# Patient Record
Sex: Male | Born: 1985 | Race: White | Hispanic: No | Marital: Single | Smoking: Current some day smoker
Health system: Southern US, Community
[De-identification: ages and names within clinical notes are randomized; demographics above are authoritative.]

---

## 2019-12-10 ENCOUNTER — Encounter (HOSPITAL_COMMUNITY): Payer: Self-pay

## 2019-12-10 ENCOUNTER — Emergency Department (HOSPITAL_COMMUNITY)
Admission: EM | Admit: 2019-12-10 | Discharge: 2019-12-11 | Disposition: A | Discharge status: Home / Self Care | Payer: 59 | Attending: Emergency Medicine | Admitting: Emergency Medicine

## 2019-12-10 ENCOUNTER — Emergency Department (HOSPITAL_COMMUNITY): Payer: 59

## 2019-12-10 ENCOUNTER — Other Ambulatory Visit: Payer: Self-pay

## 2019-12-10 DIAGNOSIS — Y9389 Activity, other specified: Secondary | ICD-10-CM | POA: Diagnosis not present

## 2019-12-10 DIAGNOSIS — S060X0A Concussion without loss of consciousness, initial encounter: Secondary | ICD-10-CM

## 2019-12-10 DIAGNOSIS — Y9269 Other specified industrial and construction area as the place of occurrence of the external cause: Secondary | ICD-10-CM | POA: Insufficient documentation

## 2019-12-10 DIAGNOSIS — W228XXA Striking against or struck by other objects, initial encounter: Secondary | ICD-10-CM | POA: Insufficient documentation

## 2019-12-10 DIAGNOSIS — Y990 Civilian activity done for income or pay: Secondary | ICD-10-CM | POA: Diagnosis not present

## 2019-12-10 DIAGNOSIS — S0990XA Unspecified injury of head, initial encounter: Secondary | ICD-10-CM

## 2019-12-10 NOTE — ED Triage Notes (Signed)
Patient arrived stating today he was hit with a concrete hose at work around 1-130pm. States he worked the rest of his shift and never lost consciousness but had occasional blurred vision. He went home and fell asleep around 6, when waking up had head and neck pain with nausea and vomiting. Denies any medication taken at home.

## 2019-12-10 NOTE — ED Provider Notes (Signed)
[facility] DEPT Provider Note   CSN: [id number] Arrival date & time: 12/10/19  2104     History Chief Complaint  Patient presents with  . Head Injury    Joseph Montes is a 34 y.o. male.  Patient presents to the emergency department with a chief complaint of head injury.  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  He states that he was manipulating the hose, and was struck in the head with the metal tip of the hose.  He states that it first knocked his hard hat off, and then the metal portion hit the back of his head quite hard.  He did not lose consciousness.  He continued on with his work day.  He states that after awakening from a nap this evening, he had a bad headache and some neck pain and had nausea and vomiting.  He denies having taken anything for symptoms.  Denies any numbness, weakness, or tingling.  The history is provided by the patient. No language interpreter was used.       History reviewed. No pertinent past medical history.  There are no problems to display for this patient.   History reviewed. No pertinent surgical history.     No family history on file.  Social History   Tobacco Use  . Smoking status: Not on file  Substance Use Topics  . Alcohol use: Not on file  . Drug use: Not on file    Home Medications Prior to Admission medications   Not on File    Allergies    Patient has no known allergies.  Review of Systems   Review of Systems  All other systems reviewed and are negative.   Physical Exam Updated Vital Signs BP 133/90 (BP Location: Left Arm)   Pulse 85   Temp 97.8 F (36.6 C) (Oral)   Resp 18   Ht 5\' 11"  (1.803 m)   Wt 70.3 kg   SpO2 100%   BMI 21.62 kg/m   Physical Exam Vitals and nursing note reviewed.  Constitutional:      Appearance: He is well-developed.  HENT:     Head: Normocephalic and atraumatic.  Eyes:   Conjunctiva/sclera: Conjunctivae normal.  Neck:     Comments: Cervical paraspinal muscles are TTP, but no bony abnormality or deformity Cardiovascular:     Rate and Rhythm: Normal rate and regular rhythm.     Heart sounds: No murmur.  Pulmonary:     Effort: Pulmonary effort is normal. No respiratory distress.     Breath sounds: Normal breath sounds.  Abdominal:     Palpations: Abdomen is soft.     Tenderness: There is no abdominal tenderness.  Musculoskeletal:     Cervical back: Neck supple.  Skin:    General: Skin is warm and dry.     Comments: No abrasions or contusions to the scalp  Neurological:     Mental Status: He is alert and oriented to person, place, and time.     Comments: Cranial nerves grossly intact, speech is clear, movements are goal oriented  Psychiatric:        Mood and Affect: Mood normal.        Behavior: Behavior normal.     ED Results / Procedures / Treatments   Labs (all labs ordered are listed, but only abnormal results are displayed) Labs Reviewed - No data to display  EKG None  Radiology CT Head Wo Contrast  Result Date: 12/10/2019 CLINICAL DATA:  Head trauma EXAM: CT HEAD WITHOUT CONTRAST CT CERVICAL SPINE WITHOUT CONTRAST TECHNIQUE: Multidetector CT imaging of the head and cervical spine was performed following the standard protocol without intravenous contrast. Multiplanar CT image reconstructions of the cervical spine were also generated. COMPARISON:  None. FINDINGS: CT HEAD FINDINGS Brain: No evidence of acute infarction, hemorrhage, hydrocephalus, extra-axial collection or mass lesion/mass effect. Vascular: No hyperdense vessel or unexpected calcification. Skull: Normal. Negative for fracture or focal lesion. Sinuses/Orbits: Mucosal thickening in the sphenoid sinuses Other: None CT CERVICAL SPINE FINDINGS Alignment: Mild reversal of cervical lordosis.  No subluxation. Skull base and vertebrae: No acute fracture. No primary bone lesion or focal  pathologic process. Soft tissues and spinal canal: No prevertebral fluid or swelling. No visible canal hematoma. Disc levels: Disc spaces are maintained. Mild asymmetric widening of the left facet joint at C5-C6. No subluxation. Negative for perched or locked facet. Patient slightly tilted to the right on coronal views. Upper chest: Negative. Other: None IMPRESSION: 1. Negative non contrasted CT appearance of the brain. 2. Mild reversal of cervical lordosis without acute fracture 3. Mild asymmetric widening of the left facet joint at C5-C6, favor positional artifact over facet joint capsule injury. Correlate with physical examination. MRI evaluation may be obtained if clinically warranted. Electronically Signed   By: Jasmine Pang M.D.   On: 12/10/2019 23:17   CT Cervical Spine Wo Contrast  Result Date: 12/10/2019 CLINICAL DATA:  Head trauma EXAM: CT HEAD WITHOUT CONTRAST CT CERVICAL SPINE WITHOUT CONTRAST TECHNIQUE: Multidetector CT imaging of the head and cervical spine was performed following the standard protocol without intravenous contrast. Multiplanar CT image reconstructions of the cervical spine were also generated. COMPARISON:  None. FINDINGS: CT HEAD FINDINGS Brain: No evidence of acute infarction, hemorrhage, hydrocephalus, extra-axial collection or mass lesion/mass effect. Vascular: No hyperdense vessel or unexpected calcification. Skull: Normal. Negative for fracture or focal lesion. Sinuses/Orbits: Mucosal thickening in the sphenoid sinuses Other: None CT CERVICAL SPINE FINDINGS Alignment: Mild reversal of cervical lordosis.  No subluxation. Skull base and vertebrae: No acute fracture. No primary bone lesion or focal pathologic process. Soft tissues and spinal canal: No prevertebral fluid or swelling. No visible canal hematoma. Disc levels: Disc spaces are maintained. Mild asymmetric widening of the left facet joint at C5-C6. No subluxation. Negative for perched or locked facet. Patient slightly  tilted to the right on coronal views. Upper chest: Negative. Other: None IMPRESSION: 1. Negative non contrasted CT appearance of the brain. 2. Mild reversal of cervical lordosis without acute fracture 3. Mild asymmetric widening of the left facet joint at C5-C6, favor positional artifact over facet joint capsule injury. Correlate with physical examination. MRI evaluation may be obtained if clinically warranted. Electronically Signed   By: Jasmine Pang M.D.   On: 12/10/2019 23:17    Procedures Procedures (including critical care time)  Medications Ordered in ED Medications - No data to display  ED Course  I have reviewed the triage vital signs and the nursing notes.  Pertinent labs & imaging results that were available during my care of the patient were reviewed by me and considered in my medical decision making (see chart for details).    MDM Rules/Calculators/A&P                      Patient here with headache.  Had head injury today.  Negative CT head.  Positional artifact on CT cervical  spine. No significant findings on my exam.  I think that the patient can follow-up with PCP or in a concussion clinic.   Return precautions given.  Final Clinical Impression(s) / ED Diagnoses Final diagnoses:  Injury of head, initial encounter  Concussion without loss of consciousness, initial encounter    Rx / DC Orders ED Discharge Orders    None       Roxy Horseman, PA-C 12/10/19 2346    Dione Booze, MD 12/11/19 [id number]

## 2019-12-12 ENCOUNTER — Ambulatory Visit: Payer: Self-pay | Admitting: *Deleted

## 2019-12-12 ENCOUNTER — Other Ambulatory Visit: Payer: Self-pay

## 2019-12-12 ENCOUNTER — Emergency Department (HOSPITAL_COMMUNITY)
Admission: EM | Admit: 2019-12-12 | Discharge: 2019-12-12 | Discharge status: Against Medical Advice | Payer: 59 | Attending: Emergency Medicine | Admitting: Emergency Medicine

## 2019-12-12 ENCOUNTER — Encounter (HOSPITAL_COMMUNITY): Payer: Self-pay

## 2019-12-12 DIAGNOSIS — F17210 Nicotine dependence, cigarettes, uncomplicated: Secondary | ICD-10-CM | POA: Diagnosis not present

## 2019-12-12 DIAGNOSIS — Y92002 Bathroom of unspecified non-institutional (private) residence single-family (private) house as the place of occurrence of the external cause: Secondary | ICD-10-CM | POA: Insufficient documentation

## 2019-12-12 DIAGNOSIS — Y999 Unspecified external cause status: Secondary | ICD-10-CM | POA: Diagnosis not present

## 2019-12-12 DIAGNOSIS — Y93E5 Activity, floor mopping and cleaning: Secondary | ICD-10-CM | POA: Diagnosis not present

## 2019-12-12 DIAGNOSIS — W01198A Fall on same level from slipping, tripping and stumbling with subsequent striking against other object, initial encounter: Secondary | ICD-10-CM | POA: Diagnosis not present

## 2019-12-12 DIAGNOSIS — R410 Disorientation, unspecified: Secondary | ICD-10-CM | POA: Insufficient documentation

## 2019-12-12 DIAGNOSIS — S0990XA Unspecified injury of head, initial encounter: Secondary | ICD-10-CM | POA: Diagnosis present

## 2019-12-12 DIAGNOSIS — S0083XA Contusion of other part of head, initial encounter: Secondary | ICD-10-CM | POA: Insufficient documentation

## 2019-12-12 DIAGNOSIS — S0990XS Unspecified injury of head, sequela: Secondary | ICD-10-CM

## 2019-12-12 NOTE — Discharge Instructions (Signed)
You were seen in the emergency department today following a head injury.  We suspect that you have a concussion, otherwise known and as a mild traumatic brain injury.    1. Medications: Ibuprofen or Tylenol for pain 2. Treatment: Rest, ice on head.  Concussion precautions given - keep patient in a quiet, not simulating, dark environment. No TV, computer use, video games until headache is resolved completely. No contact sports until cleared by the primary care provider or pediatrician. 3. Follow Up: With primary care physician in 2-3 days if headache persists.  Return to the emergency department if patient becomes lethargic, begins vomiting , develops double vision, speech difficulty, problems walking or other change in mental status.  We would like you to follow-up with the [facility], contact information below:  Address: [address], [place], [place] [address] Phone: [phone]  Per [facility] Website:   What to Expect: Evaluations at the Concussion Clinic All patients at the Concussion Clinic are given an extensive three-part evaluation that includes: a computerized test to measure memory, visual processing speed, and reaction time a test that measures the systems that integrate movement, balance, and vision an in-depth review of a detailed symptoms checklist for signs of concussion The evaluation process is critical for the treatment and recovery of a concussed patient as no two concussions are alike. Thankfully, the diagnostic tools that trained professionals use can help to better manage head injuries.  Part of the technology the doctors and staff at [facility] use in their assessments is a computerized examination called ImPACT. This tool uses six tasks to measure memory, visual processing speed, and reaction time. By analyzing the results of the examination and comparing them to average responses or a baseline  score for a patient, our staff can make an informed judgment about the patient's cognitive functions. In addition to the ImPACT examination, patients are given a Vestibular Ocular Motor Screening test. This is a simple and painless test that focuses on the systems that integrate a patient's movement, balance, and vision.  These tests are used in conjunction with a thorough review of a detailed symptoms checklist to complete the patient's evaluation and develop a treatment plan.  You do not need a referral, and you can book an appointment online. Our Concussion Hotline is staffed by trained professionals during our regular office hours: Monday - Thursday from 7:30 AM to 4:30 PM, and Fridays from 7:30 AM to 12:00 PM, and the number to call is [phone]. The Concussion Clinic team meets with patients at our [facility] office. Call today.   Further ED Instructions:   Please call and follow-up within the concussion clinic as well as your primary care provider within the next 3 to 5 days.  In the meantime we would like you to avoid strenuous/over exertional activities such as sports or running.  Please avoid excess screen time utilizing cell phones, computers, or the TV.  Please avoid activities that require significant amount of concentration.  Please try to rest as much as possible.  Please take Tylenol and/or Motrin per over-the-counter dosing instructions for any continued discomfort.  Return to the ER for new or worsening symptoms or any other concerns that you may have.

## 2019-12-12 NOTE — ED Provider Notes (Signed)
[facility]-EMERGENCY DEPT Provider Note   CSN: [id number] Arrival date & time: 12/12/19  1657     History Chief Complaint  Patient presents with  . Fall  . Head Injury    Joseph Montes is a 34 y.o. male presenting to emergency department today with chief complaint of head injury happened just prior to arrival.  Patient states he was cleaning his bathroom and he slipped on the wet floor.  He fell and hit the left side of his head on the bathtub.  He denies loss of consciousness but states he felt stunned after the fall.  He felt confused, off balance and had difficulty walking.  Patient was just seen in the emergency department x2 days ago with a head injury.  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  He had a CT that was negative and he was discharged home with symptomatic care.  He called the [facility] this morning to discuss his symptoms and per patient they recommended he was cleared to go to work and he felt ready.  His symptoms of confusion, off balance difficulty walking resolved within 1 hour of the fall.  He did not take any medications for symptoms prior to arrival.  He denies any headache but states his head feels sore where he hit the tub.  He denies any neck pain, nausea, vomiting, numbness, weakness or tingling.   History reviewed. No pertinent past medical history.  There are no problems to display for this patient.   History reviewed. No pertinent surgical history.     History reviewed. No pertinent family history.  Social History   Tobacco Use  . Smoking status: Current Some Day Smoker    Packs/day: 0.50  . Smokeless tobacco: Never Used  Substance Use Topics  . Alcohol use: Yes    Comment: twice a week   . Drug use: Not Currently    Home Medications Prior to Admission medications   Not on File    Allergies    Patient  has no known allergies.  Review of Systems   Review of Systems  All other systems are reviewed and are negative for acute change except as noted in the HPI.   Physical Exam Updated Vital Signs BP 124/83   Pulse 68   Temp (!) 97.5 F (36.4 C) (Oral)   Resp 16   SpO2 100%   Physical Exam Vitals and nursing note reviewed.  Constitutional:      General: He is not in acute distress.    Appearance: He is not ill-appearing.  HENT:     Head: Normocephalic. No raccoon eyes or Battle's sign.     Jaw: There is normal jaw occlusion.      Right Ear: Tympanic membrane and external ear normal. No hemotympanum.     Left Ear: Tympanic membrane and external ear normal. No hemotympanum.     Nose: Nose normal.     Mouth/Throat:     Mouth: Mucous membranes are moist.     Pharynx: Oropharynx is clear.  Eyes:     General: No scleral icterus.       Right eye: No discharge.        Left eye: No discharge.     Extraocular Movements: Extraocular movements intact.     Conjunctiva/sclera: Conjunctivae normal.     Pupils: Pupils are equal, round, and  reactive to light.  Neck:     Vascular: No JVD.     Comments: Full ROM intact without spinous process TTP. No bony stepoffs or deformities, no paraspinous muscle TTP or muscle spasms. No rigidity or meningeal signs. No bruising, erythema, or swelling.  Cardiovascular:     Rate and Rhythm: Normal rate and regular rhythm.     Pulses: Normal pulses.          Radial pulses are 2+ on the right side and 2+ on the left side.     Heart sounds: Normal heart sounds.  Pulmonary:     Comments: Lungs clear to auscultation in all fields. Symmetric chest rise. No wheezing, rales, or rhonchi. Abdominal:     Comments: Abdomen is soft, non-distended, and non-tender in all quadrants. No rigidity, no guarding. No peritoneal signs.  Musculoskeletal:        General: Normal range of motion.     Cervical back: Normal range of motion.  Skin:    General: Skin is warm and  dry.     Capillary Refill: Capillary refill takes less than 2 seconds.  Neurological:     Mental Status: He is oriented to person, place, and time.     GCS: GCS eye subscore is 4. GCS verbal subscore is 5. GCS motor subscore is 6.     Comments: Fluent speech, no facial droop.  Mental Status:  Alert, oriented, thought content appropriate, able to give a coherent history. Speech fluent without evidence of aphasia. Able to follow 2 step commands without difficulty.  Cranial Nerves:  II:  Peripheral visual fields grossly normal, pupils equal, round, reactive to light III,IV, VI: ptosis not present, extra-ocular motions intact bilaterally  V,VII: smile symmetric, facial light touch sensation equal VIII: hearing grossly normal to voice  X: uvula elevates symmetrically  XI: bilateral shoulder shrug symmetric and strong XII: midline tongue extension without fassiculations Motor:  Normal tone. 5/5 in upper and lower extremities bilaterally including strong and equal grip strength and dorsiflexion/plantar flexion Sensory: Pinprick and light touch normal in all extremities.  Deep Tendon Reflexes: 2+ and symmetric in the biceps and patella Cerebellar: normal finger-to-nose with bilateral upper extremities Gait: normal gait and balance CV: distal pulses palpable throughout    Psychiatric:        Behavior: Behavior normal.       ED Results / Procedures / Treatments   Labs (all labs ordered are listed, but only abnormal results are displayed) Labs Reviewed - No data to display  EKG None  Radiology CT Head Wo Contrast  Result Date: 12/10/2019 CLINICAL DATA:  Head trauma EXAM: CT HEAD WITHOUT CONTRAST CT CERVICAL SPINE WITHOUT CONTRAST TECHNIQUE: Multidetector CT imaging of the head and cervical spine was performed following the standard protocol without intravenous contrast. Multiplanar CT image reconstructions of the cervical spine were also generated. COMPARISON:  None. FINDINGS: CT HEAD  FINDINGS Brain: No evidence of acute infarction, hemorrhage, hydrocephalus, extra-axial collection or mass lesion/mass effect. Vascular: No hyperdense vessel or unexpected calcification. Skull: Normal. Negative for fracture or focal lesion. Sinuses/Orbits: Mucosal thickening in the sphenoid sinuses Other: None CT CERVICAL SPINE FINDINGS Alignment: Mild reversal of cervical lordosis.  No subluxation. Skull base and vertebrae: No acute fracture. No primary bone lesion or focal pathologic process. Soft tissues and spinal canal: No prevertebral fluid or swelling. No visible canal hematoma. Disc levels: Disc spaces are maintained. Mild asymmetric widening of the left facet joint at C5-C6. No subluxation. Negative for perched or  locked facet. Patient slightly tilted to the right on coronal views. Upper chest: Negative. Other: None IMPRESSION: 1. Negative non contrasted CT appearance of the brain. 2. Mild reversal of cervical lordosis without acute fracture 3. Mild asymmetric widening of the left facet joint at C5-C6, favor positional artifact over facet joint capsule injury. Correlate with physical examination. MRI evaluation may be obtained if clinically warranted. Electronically Signed   By: Donavan Foil M.D.   On: 12/10/2019 23:17   CT Cervical Spine Wo Contrast  Result Date: 12/10/2019 CLINICAL DATA:  Head trauma EXAM: CT HEAD WITHOUT CONTRAST CT CERVICAL SPINE WITHOUT CONTRAST TECHNIQUE: Multidetector CT imaging of the head and cervical spine was performed following the standard protocol without intravenous contrast. Multiplanar CT image reconstructions of the cervical spine were also generated. COMPARISON:  None. FINDINGS: CT HEAD FINDINGS Brain: No evidence of acute infarction, hemorrhage, hydrocephalus, extra-axial collection or mass lesion/mass effect. Vascular: No hyperdense vessel or unexpected calcification. Skull: Normal. Negative for fracture or focal lesion. Sinuses/Orbits: Mucosal thickening in the  sphenoid sinuses Other: None CT CERVICAL SPINE FINDINGS Alignment: Mild reversal of cervical lordosis.  No subluxation. Skull base and vertebrae: No acute fracture. No primary bone lesion or focal pathologic process. Soft tissues and spinal canal: No prevertebral fluid or swelling. No visible canal hematoma. Disc levels: Disc spaces are maintained. Mild asymmetric widening of the left facet joint at C5-C6. No subluxation. Negative for perched or locked facet. Patient slightly tilted to the right on coronal views. Upper chest: Negative. Other: None IMPRESSION: 1. Negative non contrasted CT appearance of the brain. 2. Mild reversal of cervical lordosis without acute fracture 3. Mild asymmetric widening of the left facet joint at C5-C6, favor positional artifact over facet joint capsule injury. Correlate with physical examination. MRI evaluation may be obtained if clinically warranted. Electronically Signed   By: Donavan Foil M.D.   On: 12/10/2019 23:17    Procedures Procedures (including critical care time)  Medications Ordered in ED Medications - No data to display  ED Course  I have reviewed the triage vital signs and the nursing notes.  Pertinent labs & imaging results that were available during my care of the patient were reviewed by me and considered in my medical decision making (see chart for details).    MDM Rules/Calculators/A&P                      Patient seen and examined. Patient presents awake, alert, hemodynamically stable, afebrile, non toxic. Neuro exam is normal. He is at his baseline. Given he had a head injury x2 days ago and now has fallen again and had signs of concussion after the fall I recommend he have another head CT to make sure there is no significant trauma or injury. Patient is requesting to be discharged home without having head CT.  He has an appointment to follow-up with his primary care provider as has an appointment scheduled in 2 days and would rather do that as  he is feeling back to his normal self.   I have discussed my concerns as pt's  provider and the possibility that this may worsen. I have specifically discussed that without further evaluation I cannot guarantee there is not a life threatening event occuring.  Pt is A&Ox4, his own POA and states understanding of my concerns and the possible consequences.  I have made pt aware that this is an [facility] discharge, but they may return at any time for further evaluation  and treatment.   Portions of this note were generated with Scientist, clinical (histocompatibility and immunogenetics). Dictation errors may occur despite best attempts at proofreading.   Final Clinical Impression(s) / ED Diagnoses Final diagnoses:  Injury of head, sequela    Rx / DC Orders ED Discharge Orders    None       Kathyrn Lass 12/12/19 2211    Melene Plan, DO 12/12/19 2215

## 2019-12-12 NOTE — Telephone Encounter (Addendum)
Pt states fell and hit head on tub and having difficulty remembering.  Pt was in [facility] ED 2 days ago, states he was hit on the head at work and had a concussion. Today fell and hit head on tub. Difficulty remembering, slow to speak, "feels off". Has a friend there to take him back to ED.  Reason for Disposition . Large swelling or bruise > 2 inches (5 cm)  Answer Assessment - Initial Assessment Questions 1. MECHANISM: "How did the injury happen?" For falls, ask: "What height did you fall from?" and "What surface did you fall against?"      Fell in bathroom, hit head on tub 2. ONSET: "When did the injury happen?" (Minutes or hours ago)      45 min 3. NEUROLOGIC SYMPTOMS: "Was there any loss of consciousness?" "Are there any other neurological symptoms?"      unsure 4. MENTAL STATUS: "Does the person know who he is, who you are, and where he is?"      Yes, but slow to think 5. LOCATION: "What part of the head was hit?"      Above left ear 6. SCALP APPEARANCE: "What does the scalp look like? Is it bleeding now?" If so, ask: "Is it difficult to stop?"      swollen 7. SIZE: For cuts, bruises, or swelling, ask: "How large is it?" (e.g., inches or centimeters)      No bleeding 8. PAIN: "Is there any pain?" If so, ask: "How bad is it?"  (e.g., Scale 1-10; or mild, moderate, severe)     Throbbing 7/10 9. TETANUS: For any breaks in the skin, ask: "When was the last tetanus booster?"     na 10. OTHER SYMPTOMS: "Do you have any other symptoms?" (e.g., neck pain, vomiting)       Ears ringing 11. PREGNANCY: "Is there any chance you are pregnant?" "When was your last menstrual period?"       na  Protocols used: HEAD INJURY-A-AH

## 2019-12-12 NOTE — ED Triage Notes (Signed)
Pt had a fall approx 2 days ago after hitting his head on a concrete hose at work and was diagnosed with a concussion. Pt reports that today, he fell at home, hitting his head on the tub. Pt reports he is unsure as to whether he had a positive LOC today. Pt is slow to answer questions, keeping his eyes closed during triage.

## 2020-08-29 IMAGING — CT CT HEAD W/O CM
4 series · 16 of 47 positions shown, 18 images · non-contrast
Comparison: None.

CLINICAL DATA: Head trauma

EXAM:
CT HEAD WITHOUT CONTRAST
CT CERVICAL SPINE WITHOUT CONTRAST
TECHNIQUE: Multidetector CT imaging of the head and cervical spine was
performed following the standard protocol without intravenous
contrast. Multiplanar CT image reconstructions of the cervical spine
were also generated.

[Series 3: head wo · axial · 0.40mm/px · z∈[-98,+22]mm · 7 of 32 slices shown, 9 images]
[im 4/32  brain]
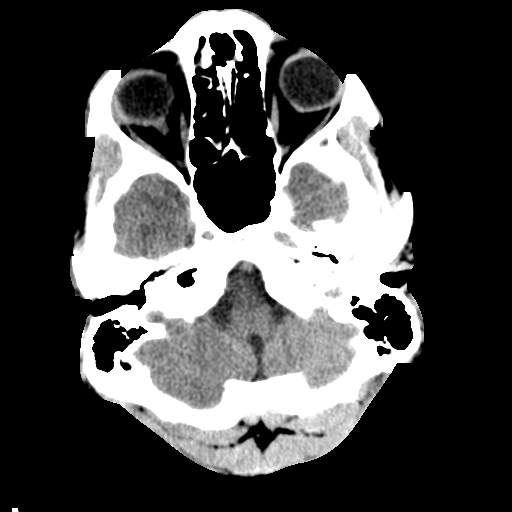
[im 4/32  bone]
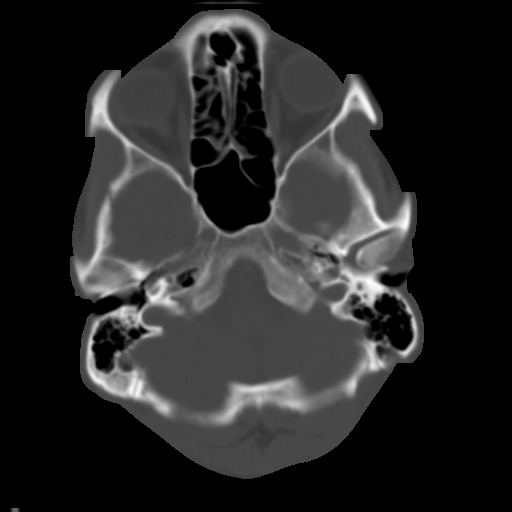
[im 8/32  brain]
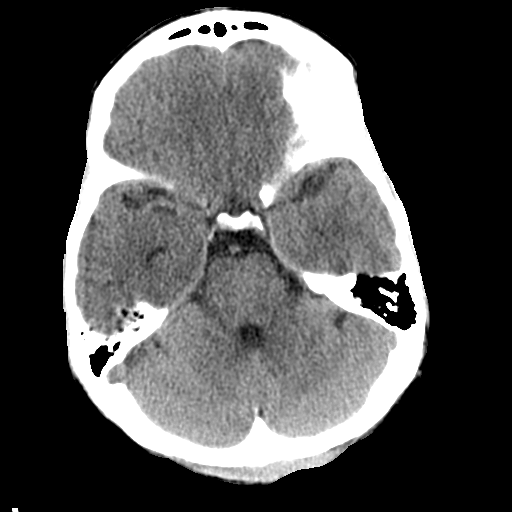
[im 12/32  brain]
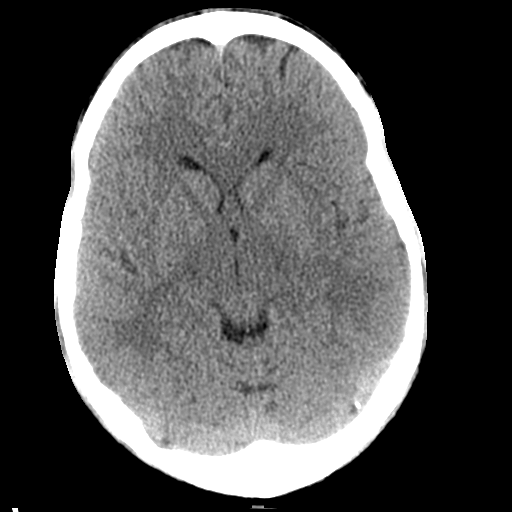
[im 16/32  brain]
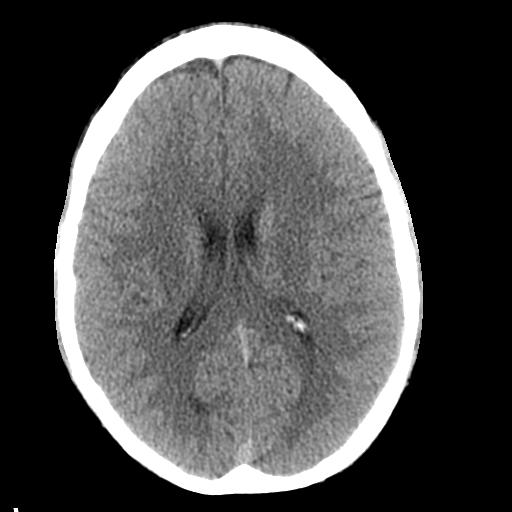
[im 20/32  brain]
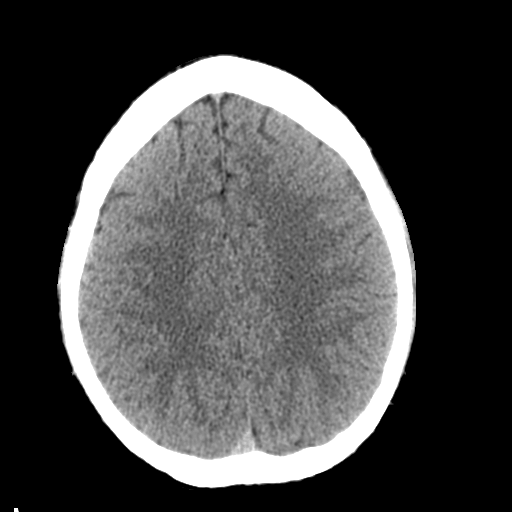
[im 20/32  bone]
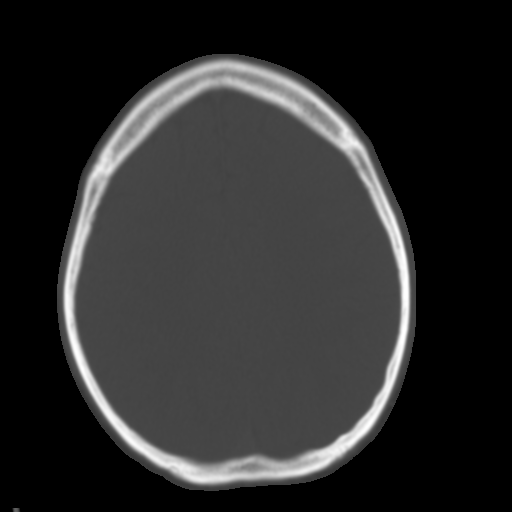
[im 24/32  brain]
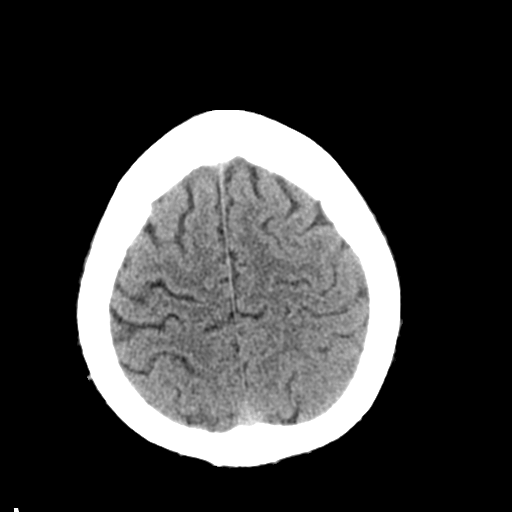
[im 28/32  brain]
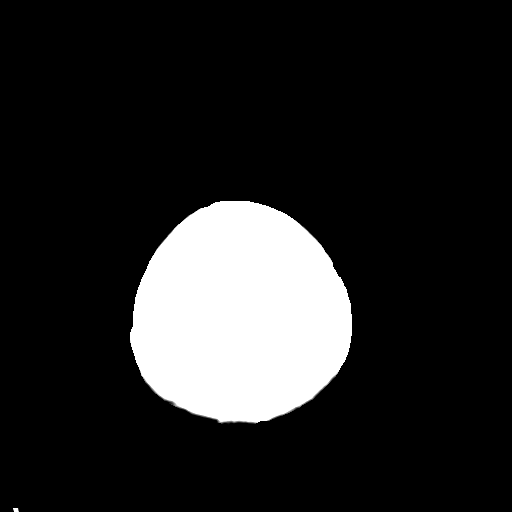

[Series 4: head bone · axial · 0.40mm/px · z∈[-99,-67]mm · 3 of 80 slices shown]
[im 8/80  bone]
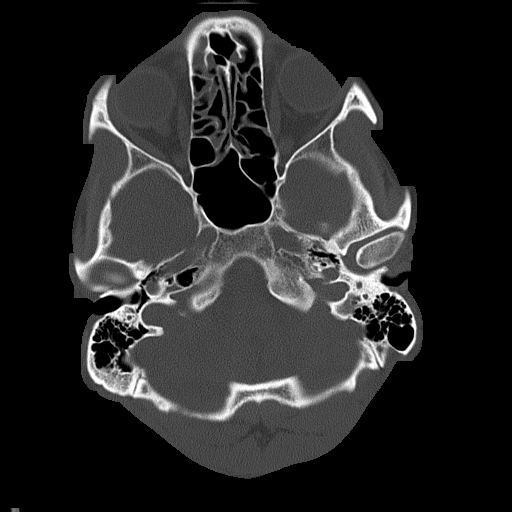
[im 16/80  bone]
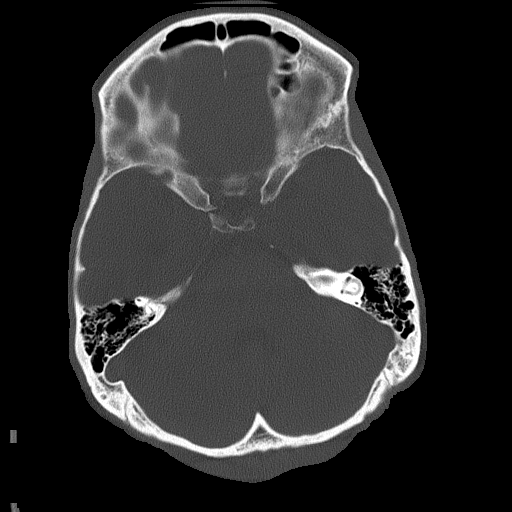
[im 24/80  bone]
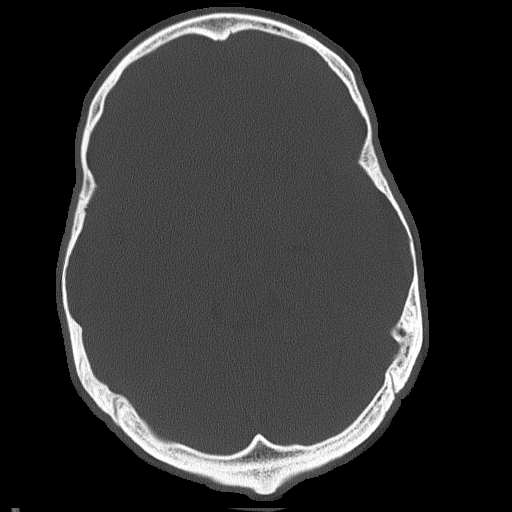

[Series 6: coronal soft tissue · coronal · 0.31mm/px · 3 of 66 slices shown]
[im 22/66  brain]
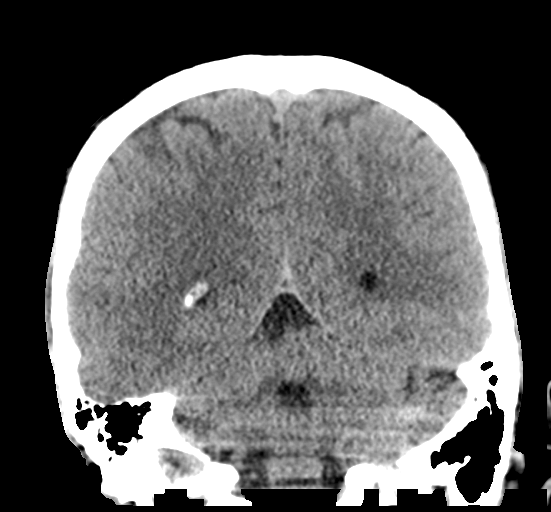
[im 29/66  brain]
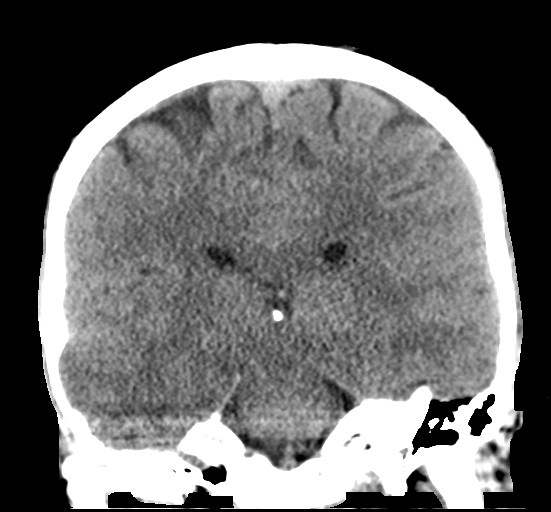
[im 37/66  brain]
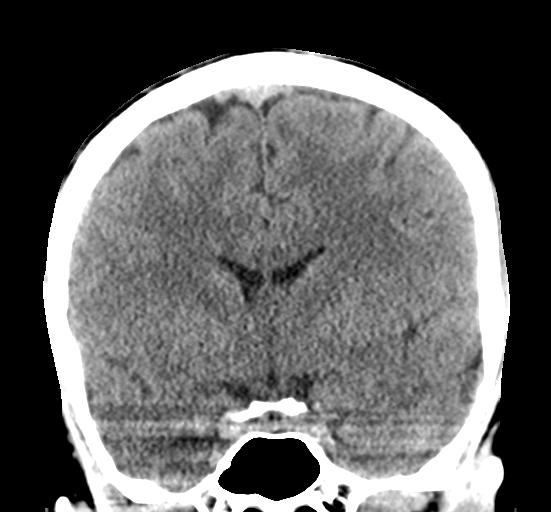

[Series 7: sagittal soft tissue · sagittal · 0.31mm/px · 3 of 58 slices shown]
[im 20/58  brain]
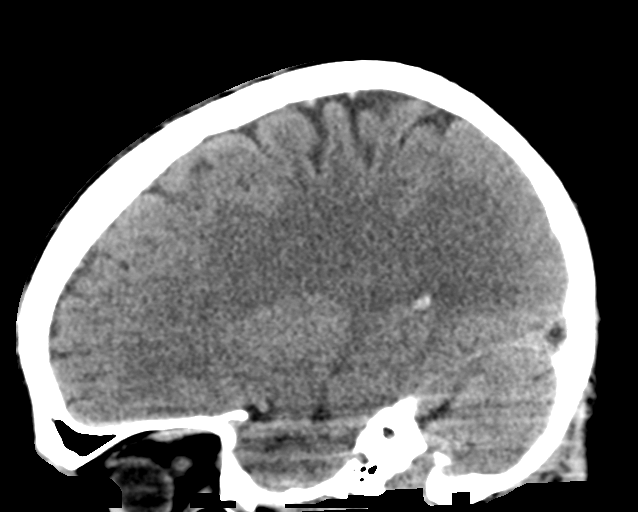
[im 29/58  brain]
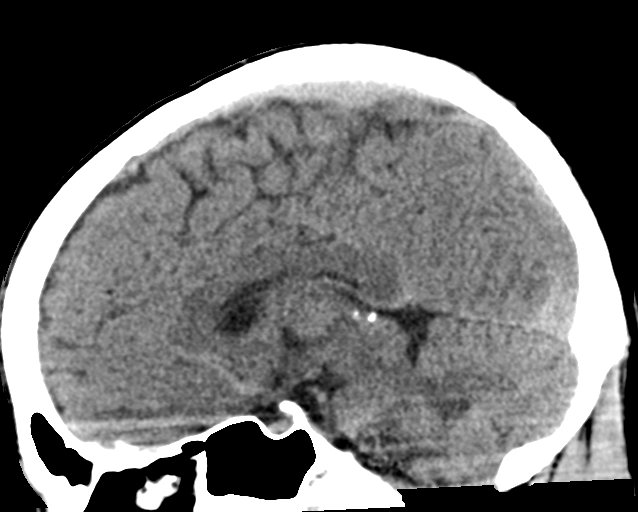
[im 39/58  brain]
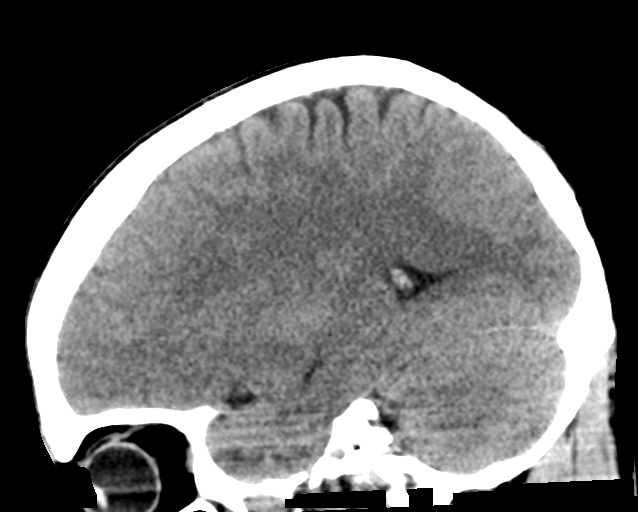

[16 of 47 positions shown; findings below may reference images not displayed]

FINDINGS: CT HEAD FINDINGS

Brain: No evidence of acute infarction, hemorrhage, hydrocephalus,
extra-axial collection or mass lesion/mass effect.

Vascular: No hyperdense vessel or unexpected calcification.

Skull: Normal. Negative for fracture or focal lesion.

Sinuses/Orbits: Mucosal thickening in the sphenoid sinuses

Other: None

CT CERVICAL SPINE FINDINGS

Alignment: Mild reversal of cervical lordosis.  No subluxation.

Skull base and vertebrae: No acute fracture. No primary bone lesion
or focal pathologic process.

Soft tissues and spinal canal: No prevertebral fluid or swelling. No
visible canal hematoma.

Disc levels: Disc spaces are maintained. Mild asymmetric widening of
the left facet joint at C5-C6. No subluxation. Negative for perched
or locked facet. Patient slightly tilted to the right on coronal
views.

Upper chest: Negative.

Other: None
IMPRESSION: 1. Negative non contrasted CT appearance of the brain.
2. Mild reversal of cervical lordosis without acute fracture
3. Mild asymmetric widening of the left facet joint at C5-C6, favor
positional artifact over facet joint capsule injury. Correlate with
physical examination. MRI evaluation may be obtained if clinically
warranted.
